# Patient Record
Sex: Female | Born: 1965 | Race: White | Hispanic: No | Marital: Married | Smoking: Never smoker
Health system: Southern US, Community
[De-identification: ages and names within clinical notes are randomized; demographics above are authoritative.]

## PROBLEM LIST (undated history)

## (undated) DIAGNOSIS — E079 Disorder of thyroid, unspecified: Secondary | ICD-10-CM

---

## 2002-03-22 ENCOUNTER — Other Ambulatory Visit: Admission: RE | Admit: 2002-03-22 | Discharge: 2002-03-22 | Payer: Self-pay | Admitting: Obstetrics and Gynecology

## 2003-07-24 ENCOUNTER — Emergency Department (HOSPITAL_COMMUNITY): Admission: EM | Admit: 2003-07-24 | Discharge: 2003-07-24 | Payer: Self-pay | Admitting: Family Medicine

## 2003-12-01 ENCOUNTER — Ambulatory Visit (HOSPITAL_COMMUNITY): Admission: RE | Admit: 2003-12-01 | Discharge: 2003-12-01 | Payer: Self-pay | Admitting: Family Medicine

## 2004-02-23 ENCOUNTER — Emergency Department (HOSPITAL_COMMUNITY): Admission: EM | Admit: 2004-02-23 | Discharge: 2004-02-23 | Payer: Self-pay | Admitting: Emergency Medicine

## 2008-05-04 ENCOUNTER — Encounter: Admission: RE | Admit: 2008-05-04 | Discharge: 2008-05-04 | Payer: Self-pay | Admitting: Family Medicine

## 2012-03-31 ENCOUNTER — Encounter (HOSPITAL_COMMUNITY): Payer: Self-pay

## 2012-03-31 ENCOUNTER — Emergency Department (HOSPITAL_COMMUNITY)
Admission: EM | Admit: 2012-03-31 | Discharge: 2012-03-31 | Disposition: A | Discharge status: Home / Self Care | Payer: BC Managed Care – PPO | Attending: Emergency Medicine | Admitting: Emergency Medicine

## 2012-03-31 ENCOUNTER — Emergency Department (HOSPITAL_COMMUNITY): Payer: BC Managed Care – PPO

## 2012-03-31 DIAGNOSIS — Y9239 Other specified sports and athletic area as the place of occurrence of the external cause: Secondary | ICD-10-CM | POA: Insufficient documentation

## 2012-03-31 DIAGNOSIS — S139XXA Sprain of joints and ligaments of unspecified parts of neck, initial encounter: Secondary | ICD-10-CM | POA: Insufficient documentation

## 2012-03-31 DIAGNOSIS — S0990XA Unspecified injury of head, initial encounter: Secondary | ICD-10-CM | POA: Insufficient documentation

## 2012-03-31 DIAGNOSIS — Y92838 Other recreation area as the place of occurrence of the external cause: Secondary | ICD-10-CM | POA: Insufficient documentation

## 2012-03-31 DIAGNOSIS — S161XXA Strain of muscle, fascia and tendon at neck level, initial encounter: Secondary | ICD-10-CM

## 2012-03-31 DIAGNOSIS — Y9352 Activity, horseback riding: Secondary | ICD-10-CM | POA: Insufficient documentation

## 2012-03-31 HISTORY — DX: Disorder of thyroid, unspecified: E07.9

## 2012-03-31 MED ORDER — CYCLOBENZAPRINE HCL 10 MG PO TABS: 10.0000 mg | ORAL_TABLET | Freq: Two times a day (BID) | ORAL | Status: AC | PRN

## 2012-03-31 MED ORDER — IBUPROFEN 800 MG PO TABS: 800.0000 mg | ORAL_TABLET | Freq: Three times a day (TID) | ORAL | Status: AC

## 2012-03-31 NOTE — ED Notes (Signed)
Pt sts horse was jumping and she was riding him and she fell off of  Him and she fell on top of her head, denies LOC, complains of neck pain and head pain.

## 2012-03-31 NOTE — ED Notes (Signed)
Reports fell off horse & hit head. Denies LOC. Had helmet on. C/o pain to posterior right side head radiating down right side neck & into shoulder. Denies dizziness, blurred vision, n/v.

## 2012-03-31 NOTE — ED Provider Notes (Signed)
Medical screening examination/treatment/procedure(s) were performed by non-physician practitioner and as supervising physician I was immediately available for consultation/collaboration.  Marwan T Powers, MD 03/31/12 2059 

## 2012-03-31 NOTE — ED Notes (Signed)
Patient transported to CT 

## 2012-03-31 NOTE — ED Provider Notes (Signed)
History     CSN: [id number]  Arrival date & time 03/31/12  1355   First MD Initiated Contact with Patient 03/31/12 1729      Chief Complaint  Patient presents with  . Neck Injury  . Head Injury    (Consider location/radiation/quality/duration/timing/severity/associated sxs/prior treatment) HPI  46 year old female presents for evaluations of head and neck injury. Patient reports she was riding a horse when she fell off, hits her head against ground and currently complaining of head and neck pain. She was wearing her helmet and felt that the helmet did take most of the impact. She denies any loss of consciousness. Pain is sharp, throbbing, worsening on R side of head and neck, non radiating, acute on onset, moderate in severity but has improved.  No other pain or other injury.  No numbness or weakness.  Did take 4 ibuprofen prior to arrival.    Past Medical History  Diagnosis Date  . Thyroid disease     No past surgical history on file.  No family history on file.  History  Substance Use Topics  . Smoking status: Never Smoker   . Smokeless tobacco: Not on file  . Alcohol Use: No    OB History    Grav Para Term Preterm Abortions TAB SAB Ect Mult Living                  Review of Systems  HENT: Positive for neck pain. Negative for neck stiffness.   Respiratory: Negative for shortness of breath.   Cardiovascular: Negative for chest pain.  Gastrointestinal: Negative for abdominal pain.  Neurological: Positive for headaches. Negative for numbness.    Allergies  Review of patient's allergies indicates not on file.  Home Medications  No current outpatient prescriptions on file.  BP 132/83  Pulse 87  Temp 98.9 F (37.2 C) (Oral)  Resp 18  SpO2 99%  LMP 02/24/2012  Physical Exam  Nursing note and vitals reviewed. Constitutional: She is oriented to person, place, and time. She appears well-developed and well-nourished. No distress.  HENT:  Head: Normocephalic  and atraumatic.  Mouth/Throat: Oropharynx is clear and moist.  Eyes: Conjunctivae normal and EOM are normal. Pupils are equal, round, and reactive to light.  Neck: Normal range of motion. Neck supple.  Cardiovascular: Normal rate and regular rhythm.   Pulmonary/Chest: Effort normal and breath sounds normal. No respiratory distress.  Abdominal: Soft. Bowel sounds are normal. She exhibits no distension. There is no tenderness.  Musculoskeletal: Normal range of motion. She exhibits tenderness (mild tenderness to R paracervical region on palpation, normal ROM.  No midline spine tenderness, no step off.). She exhibits no edema.  Neurological: She is alert and oriented to person, place, and time. She has normal reflexes. She displays normal reflexes. No cranial nerve deficit. She exhibits normal muscle tone. Coordination normal.  Skin: Skin is warm. No rash noted.  Psychiatric: She has a normal mood and affect.    ED Course  Procedures (including critical care time)  No results found for this or any previous visit. Ct Head Wo Contrast  03/31/2012  *RADIOLOGY REPORT*  Clinical Data:  Neck and head injury, fall from horse  CT HEAD WITHOUT CONTRAST  Technique:  Contiguous axial images were obtained from the base of the skull through the vertex without contrast  Comparison:  None.  Findings:  The brain has a normal appearance without evidence for hemorrhage, acute infarction, hydrocephalus, or mass lesion.  There is no extra axial fluid  collection.  The skull and paranasal sinuses are normal.  IMPRESSION: Normal CT of the head without contrast.  CT CERVICAL SPINE WITHOUT CONTRAST  Technique:  Multidetector CT imaging of the cervical spine was performed without intravenous contrast.  Multiplanar CT image reconstructions were also generated.  Comparison:  02/23/2004 cervical spine series  Findings:  Straightened cervical spine alignment may be positional or spasm related.  Moderately severe cervical  spondylosis and degenerative disc disease at C5-6.  Facets aligned.  No compression fracture, wedge shaped deformity or focal abnormality.  Normal prevertebral soft tissues.  Clear lung apices.  No soft tissue asymmetry in the neck.  IMPRESSION: C5-6 degenerative changes and spondylosis.  No acute fracture or malalignment.   Original Report Authenticated By: Judie Petit. Ruel Favors, M.D.    Ct Cervical Spine Wo Contrast  03/31/2012  *RADIOLOGY REPORT*  Clinical Data:  Neck and head injury, fall from horse  CT HEAD WITHOUT CONTRAST  Technique:  Contiguous axial images were obtained from the base of the skull through the vertex without contrast  Comparison:  None.  Findings:  The brain has a normal appearance without evidence for hemorrhage, acute infarction, hydrocephalus, or mass lesion.  There is no extra axial fluid collection.  The skull and paranasal sinuses are normal.  IMPRESSION: Normal CT of the head without contrast.  CT CERVICAL SPINE WITHOUT CONTRAST  Technique:  Multidetector CT imaging of the cervical spine was performed without intravenous contrast.  Multiplanar CT image reconstructions were also generated.  Comparison:  02/23/2004 cervical spine series  Findings:  Straightened cervical spine alignment may be positional or spasm related.  Moderately severe cervical spondylosis and degenerative disc disease at C5-6.  Facets aligned.  No compression fracture, wedge shaped deformity or focal abnormality.  Normal prevertebral soft tissues.  Clear lung apices.  No soft tissue asymmetry in the neck.  IMPRESSION: C5-6 degenerative changes and spondylosis.  No acute fracture or malalignment.   Original Report Authenticated By: Judie Petit. Ruel Favors, M.D.      1. Head/neck injury  MDM  Pt fell off a horse and hits her head and neck against ground.  No LOC.  CT scan shows no acute fx or dislocation.  On reexamination pt has normal ROM, no focal point tenderness, no radicular pain or numbness.  Recommend RICE  therapy and will give ortho referral.  Pt stable to be d/c.  Pt voice understanding and agrees with plan.    BP 132/83  Pulse 87  Temp 98.9 F (37.2 C) (Oral)  Resp 18  SpO2 99%  LMP 02/24/2012  I have reviewed nursing notes and vital signs. I personally reviewed the imaging tests through PACS system  I reviewed available ER/hospitalization records thought the EMR       Fayrene Helper, [place] 03/31/12 1836

## 2016-06-24 ENCOUNTER — Ambulatory Visit: Payer: Self-pay

## 2017-07-11 ENCOUNTER — Emergency Department (HOSPITAL_COMMUNITY)
Admission: EM | Admit: 2017-07-11 | Discharge: 2017-07-11 | Disposition: A | Discharge status: Home / Self Care | Payer: Self-pay | Attending: Emergency Medicine | Admitting: Emergency Medicine

## 2017-07-11 ENCOUNTER — Encounter (HOSPITAL_COMMUNITY): Payer: Self-pay | Admitting: Neurology

## 2017-07-11 ENCOUNTER — Emergency Department (HOSPITAL_COMMUNITY): Payer: Self-pay

## 2017-07-11 DIAGNOSIS — S42212A Unspecified displaced fracture of surgical neck of left humerus, initial encounter for closed fracture: Secondary | ICD-10-CM | POA: Insufficient documentation

## 2017-07-11 DIAGNOSIS — S42202A Unspecified fracture of upper end of left humerus, initial encounter for closed fracture: Secondary | ICD-10-CM

## 2017-07-11 DIAGNOSIS — Y929 Unspecified place or not applicable: Secondary | ICD-10-CM | POA: Insufficient documentation

## 2017-07-11 DIAGNOSIS — Y9352 Activity, horseback riding: Secondary | ICD-10-CM | POA: Insufficient documentation

## 2017-07-11 DIAGNOSIS — Z79899 Other long term (current) drug therapy: Secondary | ICD-10-CM | POA: Insufficient documentation

## 2017-07-11 DIAGNOSIS — Y999 Unspecified external cause status: Secondary | ICD-10-CM | POA: Insufficient documentation

## 2017-07-11 DIAGNOSIS — E079 Disorder of thyroid, unspecified: Secondary | ICD-10-CM | POA: Insufficient documentation

## 2017-07-11 MED ORDER — DIPHENHYDRAMINE HCL 25 MG PO TABS: 12.5000 mg | ORAL_TABLET | Freq: Four times a day (QID) | ORAL | 0 refills | Status: DC

## 2017-07-11 MED ORDER — OXYCODONE-ACETAMINOPHEN 5-325 MG PO TABS: 1.0000 | ORAL_TABLET | Freq: Four times a day (QID) | ORAL | 0 refills | Status: DC | PRN

## 2017-07-11 MED ORDER — ONDANSETRON HCL 4 MG PO TABS: 4.0000 mg | ORAL_TABLET | Freq: Four times a day (QID) | ORAL | 0 refills | Status: AC

## 2017-07-11 MED ORDER — ONDANSETRON HCL 4 MG PO TABS: 4.0000 mg | ORAL_TABLET | Freq: Four times a day (QID) | ORAL | 0 refills | Status: DC

## 2017-07-11 MED ORDER — DIPHENHYDRAMINE HCL 50 MG/ML IJ SOLN
12.5000 mg | Freq: Once | INTRAMUSCULAR | Status: AC
  Administered 2017-07-11: 12.5 mg via INTRAVENOUS
  Filled 2017-07-11: qty 1

## 2017-07-11 MED ORDER — DIPHENHYDRAMINE HCL 25 MG PO TABS: 12.5000 mg | ORAL_TABLET | Freq: Four times a day (QID) | ORAL | 0 refills | Status: AC

## 2017-07-11 MED ORDER — FENTANYL CITRATE (PF) 100 MCG/2ML IJ SOLN
50.0000 ug | Freq: Once | INTRAMUSCULAR | Status: AC
  Administered 2017-07-11: 50 ug via INTRAVENOUS
  Filled 2017-07-11: qty 2

## 2017-07-11 MED ORDER — OXYCODONE-ACETAMINOPHEN 5-325 MG PO TABS: 1.0000 | ORAL_TABLET | Freq: Four times a day (QID) | ORAL | 0 refills | Status: AC | PRN

## 2017-07-11 MED ORDER — ONDANSETRON HCL 4 MG/2ML IJ SOLN
4.0000 mg | Freq: Once | INTRAMUSCULAR | Status: AC
  Administered 2017-07-11: 4 mg via INTRAVENOUS
  Filled 2017-07-11: qty 2

## 2017-07-11 MED ORDER — HYDROMORPHONE HCL 1 MG/ML IJ SOLN
0.5000 mg | Freq: Once | INTRAMUSCULAR | Status: AC
  Administered 2017-07-11: 0.5 mg via INTRAVENOUS
  Filled 2017-07-11: qty 1

## 2017-07-11 NOTE — ED Notes (Signed)
Family at bedside. 

## 2017-07-11 NOTE — ED Notes (Signed)
Patient is alert and orientedx4.  Patient was explained discharge instructions and they understood them with no questions.  The patient's friend, Wynona LunaShelley Pennington is taking the patient home.

## 2017-07-11 NOTE — Progress Notes (Signed)
Orthopedic Tech Progress Note Patient Details:  Ellen NestStacey Van Berkel 01-22-1966 [id number]  Ortho Devices Type of Ortho Device: Shoulder immobilizer Ortho Device/Splint Interventions: Application   Post Interventions Patient Tolerated: Well Instructions Provided: Care of device   Saul FordyceJennifer C Genese Quebedeaux 07/11/2017, 5:23 PM

## 2017-07-11 NOTE — ED Triage Notes (Signed)
Per ems- Pt was horseback riding, her horse wouldn't jump, she flew off, landed on metal fence, left upper arm taking impact, she was wearing her helmet. Upper arm deformity to humerus. Fall off the horse was 5 feet. Sensation intact. 200 mcg fentanyl given. BP 122/74, RR 20, 104 HR. Denies n/v. Denies LOC. Is a x 4. No neck or back pain.

## 2017-07-11 NOTE — ED Notes (Signed)
Spoke to ortho tech Victorino DikeJennifer, she's bringing shoulder sling immobilizer for Left

## 2017-07-11 NOTE — ED Notes (Signed)
The patient's friend Boyd Kerbsenny is at the bedside.  Her other friend Barron SchmidShelly Henning is driving patient home.

## 2017-07-11 NOTE — Discharge Instructions (Signed)
Please read attached information. If you experience any new or worsening signs or symptoms please return to the emergency room for evaluation. Please follow-up with orthopedic specialist first thing Monday to schedule follow-up appointment.  Please use medication prescribed only as directed and discontinue taking if you have any concerning signs or symptoms.

## 2017-07-11 NOTE — ED Provider Notes (Signed)
[facility] Provider Note   CSN: [id number] Arrival date & time: 07/11/17  1414   History   Chief Complaint Chief Complaint  Patient presents with  . Fall    HPI Ellen Flores is a 52 y.o. female.  HPI   52 year old female presents status post fall.  Patient reports she was riding a horse when she fell off striking her left shoulder.  She notes immediate pain to the proximal humerus and the anterior shoulder unable to range the affected shoulder.  She denies any loss of distal sensation strength or motor function including the elbow wrist or hand.  She denies any other injuries including chest pain, or back pain.  No distal neurological deficits.  Patient was brought via EMS where she was given fentanyl which provided temporary symptomatic improvement.  Patient reports she was wearing a helmet.   Past Medical History:  Diagnosis Date  . Thyroid disease     There are no active problems to display for this patient.   History reviewed. No pertinent surgical history.  OB History    No data available       Home Medications    Prior to Admission medications   Medication Sig Start Date End Date Taking? Authorizing Provider  ibuprofen (ADVIL,MOTRIN) 800 MG tablet Take 1 tablet (800 mg total) by mouth 3 (three) times daily. 03/31/12  Yes Fayrene Helperran, [place], PA-C  NALTREXONE HCL PO Take 1 tablet by mouth at bedtime.   Yes [provider]  thyroid (ARMOUR THYROID) 60 MG tablet Take 120 mg by mouth daily.    Yes [provider]  VITAMIN D, CHOLECALCIFEROL, PO Take 1 tablet by mouth daily.   Yes [provider]  cyclobenzaprine (FLEXERIL) 10 MG tablet Take 1 tablet (10 mg total) by mouth 2 (two) times daily as needed for muscle spasms. Patient not taking: Reported on 07/11/2017 03/31/12   Fayrene Helperran, [place], PA-C    Family History No family history on file.  Social History Social History   Tobacco Use  . Smoking status:  Never Smoker  Substance Use Topics  . Alcohol use: No  . Drug use: No     Allergies   Codeine   Review of Systems Review of Systems  All other systems reviewed and are negative.   Physical Exam Updated Vital Signs BP 107/74   Pulse 77   Temp 97.6 F (36.4 C) (Oral)   Resp (!) 30   Ht 5' 4.5" (1.638 m)   Wt 81.6 kg (180 lb)   SpO2 100%   BMI 30.42 kg/m   Physical Exam  Constitutional: She is oriented to person, place, and time. She appears well-developed and well-nourished.  HENT:  Head: Normocephalic and atraumatic.  Eyes: Conjunctivae are normal. Pupils are equal, round, and reactive to light. Right eye exhibits no discharge. Left eye exhibits no discharge. No scleral icterus.  Neck: Normal range of motion. No JVD present. No tracheal deviation present.  Pulmonary/Chest: Effort normal. No stridor.  No pain with AP or lateral compression of the chest  Abdominal:  Abdomen soft nontender to palpation  Musculoskeletal:  Left shoulder with proximal humerus, exquisite tenderness to palpation of the anterior shoulder and proximal humerus, remainder of remedy nontender to palpation including elbow wrist and hand, grip strength 5 out of 5 radial pulse 2+ sensation intact full active range of motion of the fingers.  No CT or L-spine tenderness full active range of motion of the neck without  pain right upper and bilateral lower extremities nontender to palpation full active range of motion sensation intact  Hip stable with AP and lateral compression without pain  Neurological: She is alert and oriented to person, place, and time. Coordination normal.  Skin: Skin is warm.  Psychiatric: She has a normal mood and affect. Her behavior is normal. Judgment and thought content normal.  Nursing note and vitals reviewed.    ED Treatments / Results  Labs (all labs ordered are listed, but only abnormal results are displayed) Labs Reviewed - No data to display  EKG  EKG  Interpretation None       Radiology Dg Shoulder Left  Result Date: 07/11/2017 CLINICAL DATA:  Acute left shoulder pain following fall off horse today. Initial encounter. EXAM: LEFT SHOULDER - 2+ VIEW COMPARISON:  None. FINDINGS: A minimally comminuted fracture involving the humeral head and neck identified with 5 mm posterior displacement. The humeral head is located without evidence of dislocation. No other significant abnormalities noted. IMPRESSION: Minimally comminuted fracture involving the humeral head and neck Electronically Signed   By: Harmon Pier M.D.   On: 07/11/2017 15:32    Procedures Procedures (including critical care time)  Medications Ordered in ED Medications  fentaNYL (SUBLIMAZE) injection 50 mcg (50 mcg Intravenous Given 07/11/17 1445)  HYDROmorphone (DILAUDID) injection 0.5 mg (0.5 mg Intravenous Given 07/11/17 1614)  ondansetron (ZOFRAN) injection 4 mg (4 mg Intravenous Given 07/11/17 1613)  diphenhydrAMINE (BENADRYL) injection 12.5 mg (12.5 mg Intravenous Given 07/11/17 1614)   Initial Impression / Assessment and Plan / ED Course  I have reviewed the triage vital signs and the nursing notes.  Pertinent labs & imaging results that were available during my care of the patient were reviewed by me and considered in my medical decision making (see chart for details).    -   Final diagnoses:  Closed fracture of proximal end of left humerus, unspecified fracture morphology, initial encounter     Labs:    Imaging: DG shoulder left  Consults:  Therapeutics: Fentanyl, Dilaudid, Zofran, Benadryl  Discharge Meds: Percocet  Assessment/Plan: 52 year old female presents today status post shoulder injury.  She has minimally comminuted fracture involving the humeral head and neck.  Patient has no distal neurological deficits, no other injuries to note.  Patient does have a allergy to codeine reporting that she gets severely nauseous and throws up.  She notes she is also had  this with other pain medications but is unable to specify exactly which ones.  She was given Dilaudid here with Zofran and Benadryl with no apparent side effects.  Due to ongoing pain patient will be prescribed an opioid as an outpatient, I will give her antinausea medicine and Benadryl.  Instructed return immediately if she has any new or worsening signs or symptoms.  Instructed patient to follow-up closely as an outpatient with orthopedics after sling immobilizer was placed.  Patient verbalized strict return precautions and had no further questions or concerns at time of discharge.      ED Discharge Orders    None       Eyvonne Mechanic, Cordelia Poche 07/11/17 1705    Phineas Real Latanya Maudlin, MD 07/11/17 1726

## 2019-02-12 IMAGING — DX DG SHOULDER 2+V*L*
3 series · 3 of 3 positions shown · non-contrast
Comparison: None.

CLINICAL DATA: Acute left shoulder pain following fall off horse
today. Initial encounter.

EXAM:
LEFT SHOULDER - 2+ VIEW

[shoulder grashey]
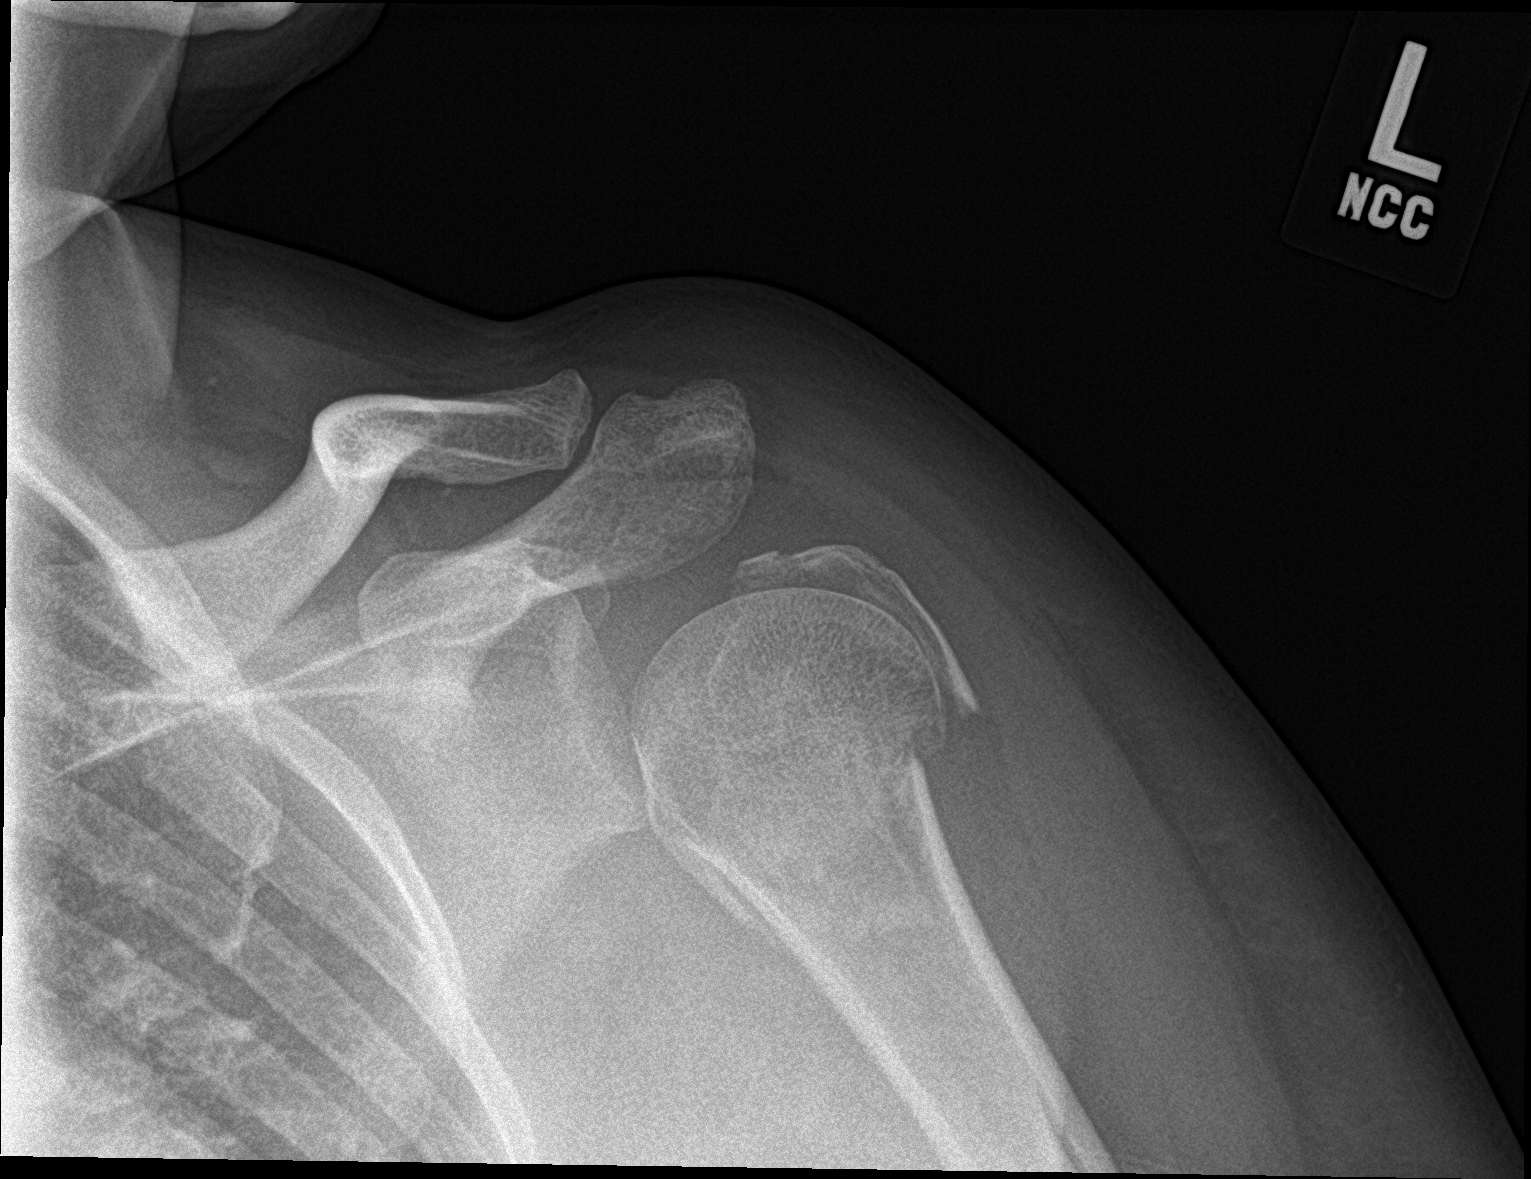

[shoulder y view]
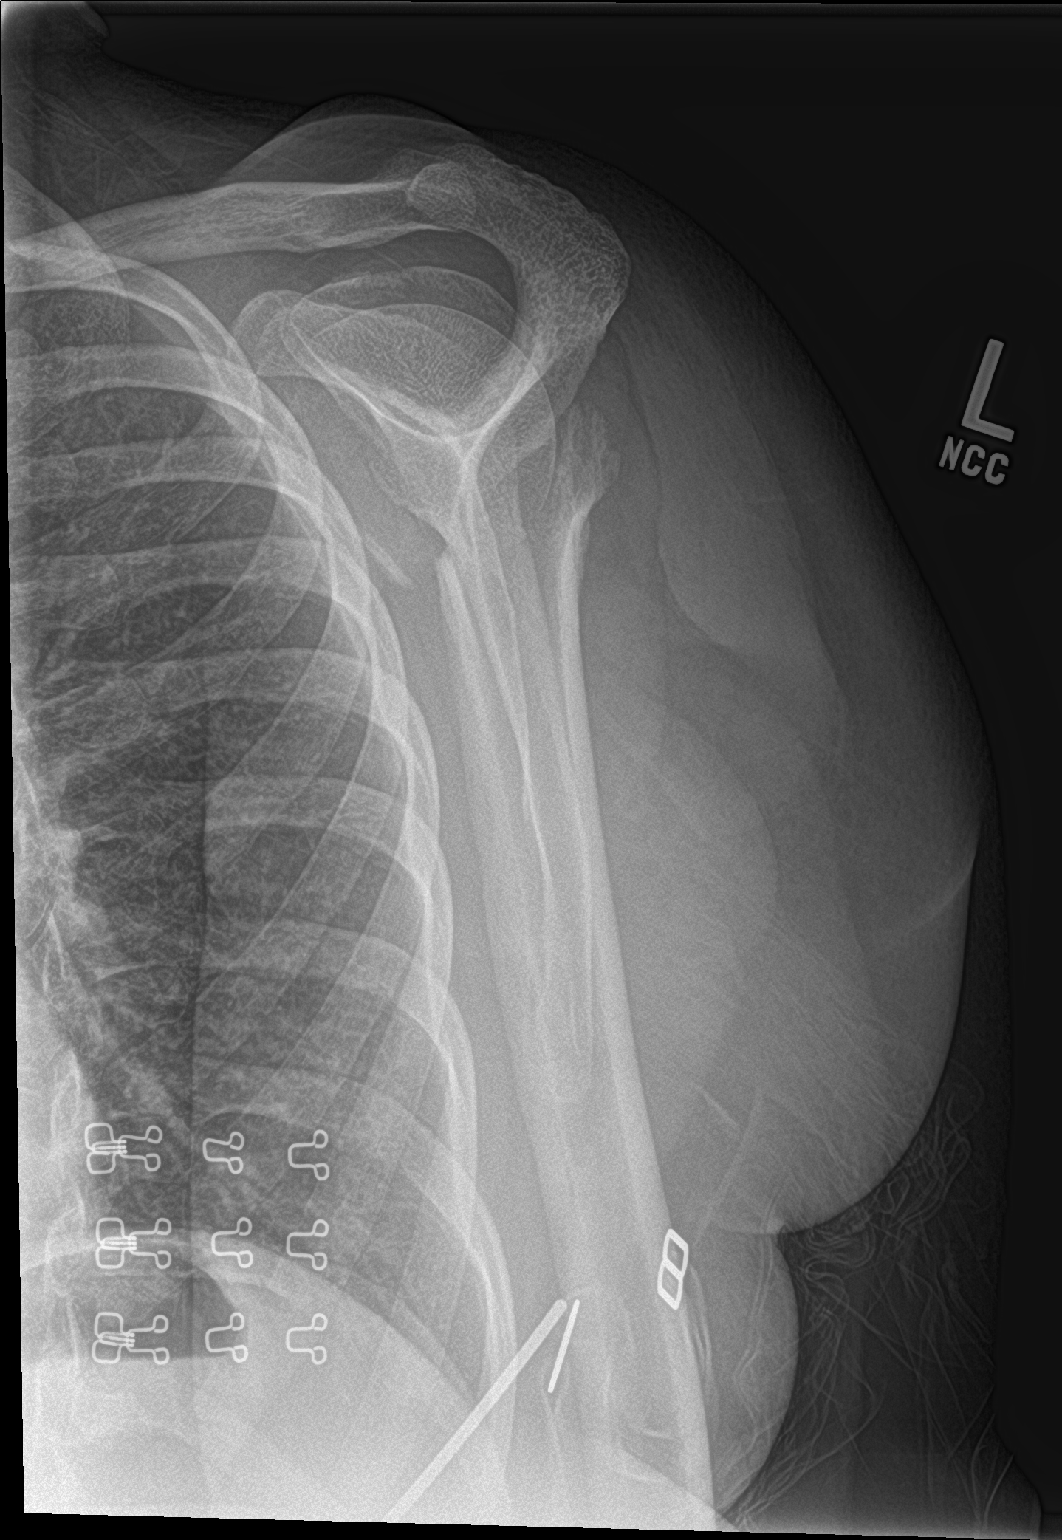

[shoulder ap neutral]
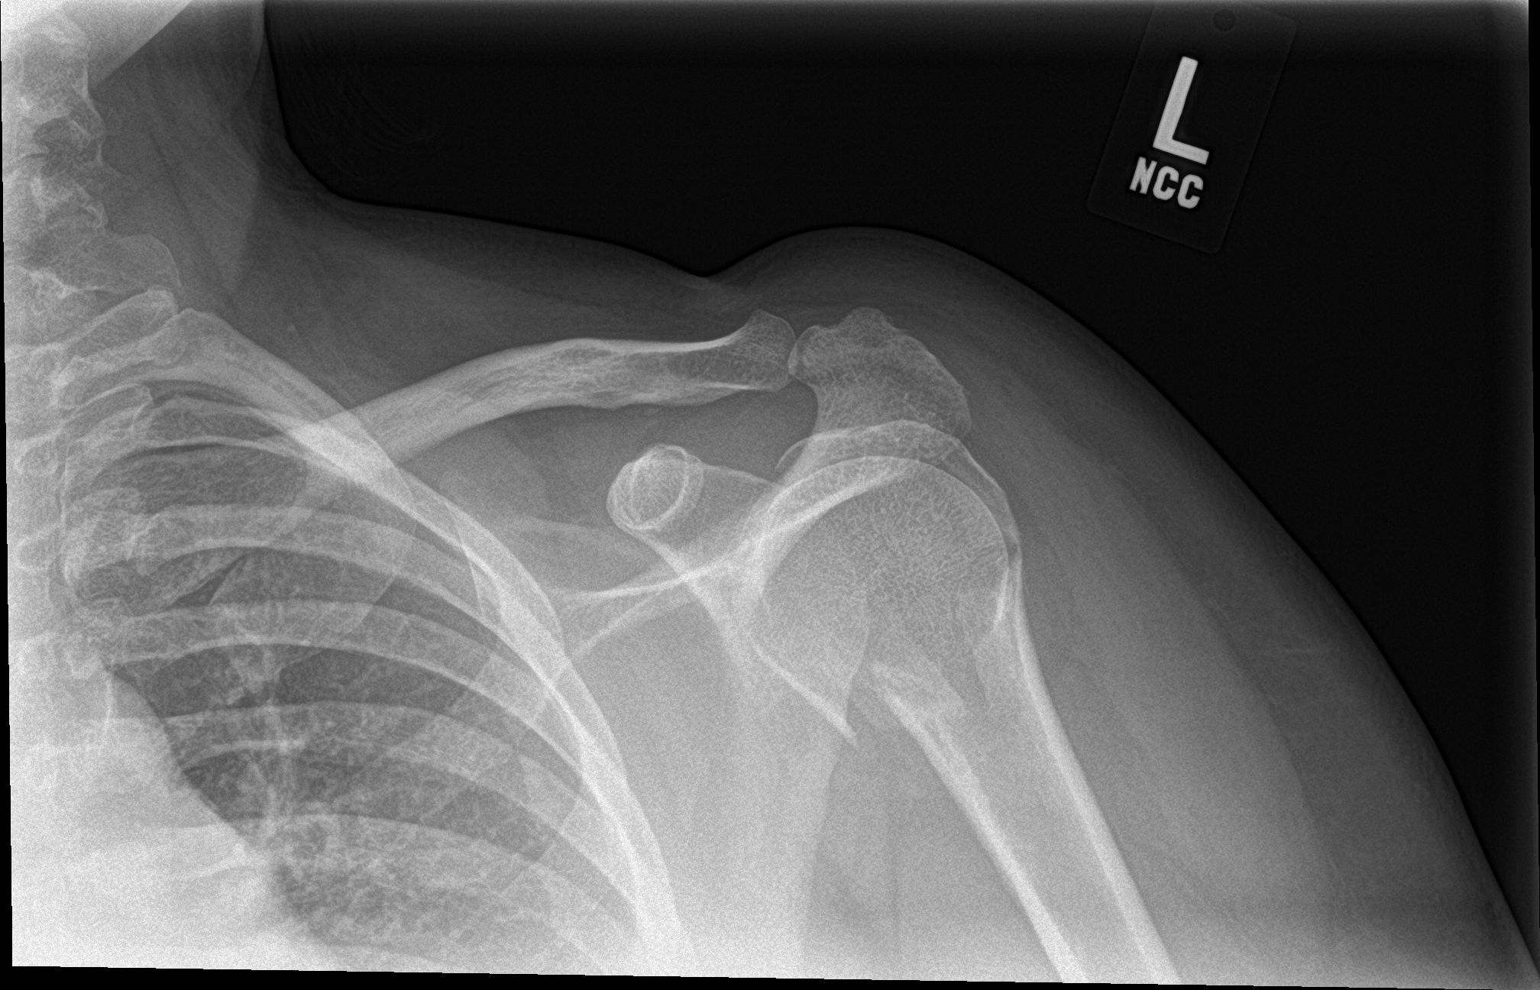

[3 of 3 positions shown; findings below may reference images not displayed]

FINDINGS: A minimally comminuted fracture involving the humeral head and neck
identified with 5 mm posterior displacement.

The humeral head is located without evidence of dislocation.

No other significant abnormalities noted.
IMPRESSION: Minimally comminuted fracture involving the humeral head and neck
